# Patient Record
Sex: Female | Born: 1995 | Race: White | Hispanic: No | Marital: Single | Smoking: Never smoker
Health system: Southern US, Community
[De-identification: ages and names within clinical notes are randomized; demographics above are authoritative.]

---

## 2015-01-14 ENCOUNTER — Observation Stay
Admission: EM | Admit: 2015-01-14 | Discharge: 2015-01-15 | Disposition: A | Discharge status: Home / Self Care | Payer: Managed Care, Other (non HMO) | Attending: Surgery | Admitting: Surgery

## 2015-01-14 ENCOUNTER — Emergency Department: Payer: Managed Care, Other (non HMO)

## 2015-01-14 ENCOUNTER — Encounter: Payer: Self-pay | Admitting: Emergency Medicine

## 2015-01-14 DIAGNOSIS — R1010 Upper abdominal pain, unspecified: Secondary | ICD-10-CM | POA: Diagnosis not present

## 2015-01-14 DIAGNOSIS — R1031 Right lower quadrant pain: Principal | ICD-10-CM | POA: Insufficient documentation

## 2015-01-14 DIAGNOSIS — R109 Unspecified abdominal pain: Secondary | ICD-10-CM | POA: Diagnosis present

## 2015-01-14 DIAGNOSIS — N390 Urinary tract infection, site not specified: Secondary | ICD-10-CM | POA: Diagnosis present

## 2015-01-14 LAB — URINALYSIS COMPLETE WITH MICROSCOPIC (ARMC ONLY)
BILIRUBIN URINE: NEGATIVE
Glucose, UA: NEGATIVE mg/dL
Nitrite: NEGATIVE
PH: 5 (ref 5.0–8.0)
Protein, ur: NEGATIVE mg/dL
Specific Gravity, Urine: 1.014 (ref 1.005–1.030)

## 2015-01-14 LAB — COMPREHENSIVE METABOLIC PANEL
ALT: 12 U/L — AB (ref 14–54)
AST: 16 U/L (ref 15–41)
Albumin: 4.3 g/dL (ref 3.5–5.0)
Alkaline Phosphatase: 46 U/L (ref 38–126)
Anion gap: 8 (ref 5–15)
BUN: 8 mg/dL (ref 6–20)
CHLORIDE: 107 mmol/L (ref 101–111)
CO2: 22 mmol/L (ref 22–32)
CREATININE: 0.78 mg/dL (ref 0.44–1.00)
Calcium: 9.2 mg/dL (ref 8.9–10.3)
GFR calc Af Amer: >60 mL/min (ref >60)
GFR calc non Af Amer: >60 mL/min (ref >60)
Glucose, Bld: 117 mg/dL — ABNORMAL HIGH (ref 65–99)
POTASSIUM: 3.8 mmol/L (ref 3.5–5.1)
SODIUM: 137 mmol/L (ref 135–145)
Total Bilirubin: 0.6 mg/dL (ref 0.3–1.2)
Total Protein: 7.3 g/dL (ref 6.5–8.1)

## 2015-01-14 LAB — CBC
HEMATOCRIT: 38.6 % (ref 35.0–47.0)
Hemoglobin: 12.7 g/dL (ref 12.0–16.0)
MCH: 26 pg (ref 26.0–34.0)
MCHC: 32.9 g/dL (ref 32.0–36.0)
MCV: 79 fL — AB (ref 80.0–100.0)
PLATELETS: 262 10*3/uL (ref 150–440)
RBC: 4.89 MIL/uL (ref 3.80–5.20)
RDW: 16.8 % — AB (ref 11.5–14.5)
WBC: 9.3 10*3/uL (ref 3.6–11.0)

## 2015-01-14 LAB — POCT PREGNANCY, URINE: PREG TEST UR: NEGATIVE

## 2015-01-14 LAB — LIPASE, BLOOD: LIPASE: 19 U/L (ref 11–51)

## 2015-01-14 MED ORDER — IOHEXOL 300 MG/ML  SOLN
100.0000 mL | Freq: Once | INTRAMUSCULAR | Status: AC | PRN
  Administered 2015-01-14: 100 mL via INTRAVENOUS
  Filled 2015-01-14: qty 100

## 2015-01-14 MED ORDER — DEXTROSE 5 % IV SOLN
1.0000 g | Freq: Once | INTRAVENOUS | Status: AC
  Administered 2015-01-14: 1 g via INTRAVENOUS
  Filled 2015-01-14: qty 10

## 2015-01-14 MED ORDER — DEXTROSE 5 % IV SOLN
INTRAVENOUS | Status: AC
  Administered 2015-01-14: 1 g via INTRAVENOUS
  Filled 2015-01-14: qty 10

## 2015-01-14 MED ORDER — IOHEXOL 240 MG/ML SOLN
25.0000 mL | Freq: Once | INTRAMUSCULAR | Status: AC | PRN
  Administered 2015-01-14: 25 mL via ORAL
  Filled 2015-01-14: qty 25

## 2015-01-14 MED ORDER — CEPHALEXIN 500 MG PO CAPS: 500.0000 mg | ORAL_CAPSULE | Freq: Two times a day (BID) | ORAL | Status: DC

## 2015-01-14 MED ORDER — SODIUM CHLORIDE 0.9 % IV BOLUS (SEPSIS)
1000.0000 mL | Freq: Once | INTRAVENOUS | Status: AC
  Administered 2015-01-14: 1000 mL via INTRAVENOUS

## 2015-01-14 NOTE — Discharge Instructions (Signed)
You were evaluated for abdominal pain, and found have a urinary tract infection and her being treated with antibiotic KEFLEX. Your CT scan showed the possibility of early appendicitis, but in discussion with the general surgeon, Dr. Excell Seltzer, you all decided to discharge home tonight and return to the emergency department at any point time if you worsen.  Return to emergency for any worsening abdominal pain, fever, vomiting and diarrhea with concern for dehydration, vaginal pain or discharge, or any other symptoms concerning to you.   Abdominal Pain, Adult Many things can cause abdominal pain. Usually, abdominal pain is not caused by a disease and will improve without treatment. It can often be observed and treated at home. Your health care provider will do a physical exam and possibly order blood tests and X-rays to help determine the seriousness of your pain. However, in many cases, more time must pass before a clear cause of the pain can be found. Before that point, your health care provider may not know if you need more testing or further treatment. HOME CARE INSTRUCTIONS Monitor your abdominal pain for any changes. The following actions may help to alleviate any discomfort you are experiencing:  Only take over-the-counter or prescription medicines as directed by your health care provider.  Do not take laxatives unless directed to do so by your health care provider.  Try a clear liquid diet (broth, tea, or water) as directed by your health care provider. Slowly move to a bland diet as tolerated. SEEK MEDICAL CARE IF:  You have unexplained abdominal pain.  You have abdominal pain associated with nausea or diarrhea.  You have pain when you urinate or have a bowel movement.  You experience abdominal pain that wakes you in the night.  You have abdominal pain that is worsened or improved by eating food.  You have abdominal pain that is worsened with eating fatty foods.  You have a  fever. SEEK IMMEDIATE MEDICAL CARE IF:  Your pain does not go away within 2 hours.  You keep throwing up (vomiting).  Your pain is felt only in portions of the abdomen, such as the right side or the left lower portion of the abdomen.  You pass bloody or black tarry stools. MAKE SURE YOU:  Understand these instructions.  Will watch your condition.  Will get help right away if you are not doing well or get worse.   This information is not intended to replace advice given to you by your health care provider. Make sure you discuss any questions you have with your health care provider.   Document Released: 10/04/2004 Document Revised: 09/15/2014 Document Reviewed: 09/03/2012 Elsevier Interactive Patient Education 2016 [employer] Inc[employer]  Urinary Tract Infection A urinary tract infection (UTI) can occur any place along the urinary tract. The tract includes the kidneys, ureters, bladder, and urethra. A type of germ called bacteria often causes a UTI. UTIs are often helped with antibiotic medicine.  HOME CARE   If given, take antibiotics as told by your doctor. Finish them even if you start to feel better.  Drink enough fluids to keep your pee (urine) clear or pale yellow.  Avoid tea, drinks with caffeine, and bubbly (carbonated) drinks.  Pee often. Avoid holding your pee in for a long time.  Pee before and after having sex (intercourse).  Wipe from front to back after you poop (bowel movement) if you are a woman. Use each tissue only once. GET HELP RIGHT AWAY IF:   You have back pain.  You have lower belly (abdominal) pain.  You have chills.  You feel sick to your stomach (nauseous).  You throw up (vomit).  Your burning or discomfort with peeing does not go away.  You have a fever.  Your symptoms are not better in 3 days. MAKE SURE YOU:   Understand these instructions.  Will watch your condition.  Will get help right away if you are not doing well or get worse.    This information is not intended to replace advice given to you by your health care provider. Make sure you discuss any questions you have with your health care provider.   Document Released: 06/13/2007 Document Revised: 01/15/2014 Document Reviewed: 07/26/2011 Elsevier Interactive Patient Education [employer] Elsevier Inc.

## 2015-01-14 NOTE — Consult Note (Signed)
Surgical Consultation  01/14/2015  Zyria Fiscus is an 20 y.o. female.   CC: Lower abdominal pain  HPI: This a patient with less than 24 hours of lower abdominal pain it started in the upper abdomen is moved to both lower quadrants she states that she feels much better now and has not been given any pain medications but her pain is improved over what it was. At an episode like this before. She denies fevers or chills. Had a single emesis yesterday and a single emesis today but is no longer nauseated. She denies dysuria denies back pain  History reviewed. No pertinent past medical history.  History reviewed. No pertinent past surgical history.  No family history on file.  Social History:  reports that she has never smoked. She has never used smokeless tobacco. She reports that she drinks alcohol. She reports that she does not use illicit drugs.  Allergies: No Known Allergies  Medications reviewed.   Review of Systems:   Review of Systems  Constitutional: Negative for fever, chills and weight loss.  HENT: Negative.   Eyes: Negative.   Respiratory: Negative.   Cardiovascular: Negative.   Gastrointestinal: Positive for nausea, vomiting and abdominal pain. Negative for heartburn, diarrhea, constipation, blood in stool and melena.  Genitourinary: Negative for dysuria, urgency and frequency.  Musculoskeletal: Negative.   Skin: Negative.   Neurological: Negative.   Endo/Heme/Allergies: Negative.   Psychiatric/Behavioral: Negative.      Physical Exam:  BP 125/63 mmHg  Pulse 72  Temp(Src) 98.7 F (37.1 C) (Oral)  Resp 16  Ht _0  (1.803 m)  Wt 155 lb (70.308 kg)  BMI 21.63 kg/m2  SpO2 100%  LMP 12/31/2014  Physical Exam  Constitutional: She is oriented to person, place, and time.  Pulmonary/Chest: Effort normal. No respiratory distress. She has no wheezes.  Abdominal: Soft. She exhibits no distension. There is tenderness. There is no rebound and no guarding.  Minimal  abdominal tenderness in the lower quadrants left greater than right with no percussion or rebound tenderness and a negative Rovsing sign  Musculoskeletal: Normal range of motion. She exhibits no edema or tenderness.  Neurological: She is alert and oriented to person, place, and time.  Skin: Skin is warm and dry. No rash noted. No erythema.  Psychiatric: Mood and affect normal.  Vitals reviewed.     Results for orders placed or performed during the hospital encounter of 01/14/15 (from the past 48 hour(s))  Lipase, blood     Status: None   Collection Time: 01/14/15  6:40 PM  Result Value Ref Range   Lipase 19 11 - 51 U/L  Comprehensive metabolic panel     Status: Abnormal   Collection Time: 01/14/15  6:40 PM  Result Value Ref Range   Sodium 137 135 - 145 mmol/L   Potassium 3.8 3.5 - 5.1 mmol/L   Chloride 107 101 - 111 mmol/L   CO2 22 22 - 32 mmol/L   Glucose, Bld 117 (H) 65 - 99 mg/dL   BUN 8 6 - 20 mg/dL   Creatinine, Ser 0.78 0.44 - 1.00 mg/dL   Calcium 9.2 8.9 - 10.3 mg/dL   Total Protein 7.3 6.5 - 8.1 g/dL   Albumin 4.3 3.5 - 5.0 g/dL   AST 16 15 - 41 U/L   ALT 12 (L) 14 - 54 U/L   Alkaline Phosphatase 46 38 - 126 U/L   Total Bilirubin 0.6 0.3 - 1.2 mg/dL   GFR calc non Af Amer >60 >  60 mL/min   GFR calc Af Amer >60 >60 mL/min    Comment: (NOTE) The eGFR has been calculated using the CKD EPI equation. This calculation has not been validated in all clinical situations. eGFR's persistently <60 mL/min signify possible Chronic Kidney Disease.    Anion gap 8 5 - 15  CBC     Status: Abnormal   Collection Time: 01/14/15  6:40 PM  Result Value Ref Range   WBC 9.3 3.6 - 11.0 K/uL   RBC 4.89 3.80 - 5.20 MIL/uL   Hemoglobin 12.7 12.0 - 16.0 g/dL   HCT 38.6 35.0 - 47.0 %   MCV 79.0 (L) 80.0 - 100.0 fL   MCH 26.0 26.0 - 34.0 pg   MCHC 32.9 32.0 - 36.0 g/dL   RDW 16.8 (H) 11.5 - 14.5 %   Platelets 262 150 - 440 K/uL  Urinalysis complete, with microscopic ([facility] only)      Status: Abnormal   Collection Time: 01/14/15  6:40 PM  Result Value Ref Range   Color, Urine YELLOW (A) YELLOW   APPearance HAZY (A) CLEAR   Glucose, UA NEGATIVE NEGATIVE mg/dL   Bilirubin Urine NEGATIVE NEGATIVE   Ketones, ur 1+ (A) NEGATIVE mg/dL   Specific Gravity, Urine 1.014 1.005 - 1.030   Hgb urine dipstick 1+ (A) NEGATIVE   pH 5.0 5.0 - 8.0   Protein, ur NEGATIVE NEGATIVE mg/dL   Nitrite NEGATIVE NEGATIVE   Leukocytes, UA TRACE (A) NEGATIVE   RBC / HPF 0-5 0 - 5 RBC/hpf   WBC, UA 6-30 0 - 5 WBC/hpf   Bacteria, UA RARE (A) NONE SEEN   Squamous Epithelial / LPF 6-30 (A) NONE SEEN   Mucous PRESENT   Pregnancy, urine POC     Status: None   Collection Time: 01/14/15  6:43 PM  Result Value Ref Range   Preg Test, Ur NEGATIVE NEGATIVE    Comment:        THE SENSITIVITY OF THIS METHODOLOGY IS >24 mIU/mL    Ct Abdomen Pelvis W Contrast  01/14/2015  CLINICAL DATA:  Upper abdominal pain beginning yesterday. Pain more in the low abdomen today. Some nausea vomiting. EXAM: CT ABDOMEN AND PELVIS WITH CONTRAST TECHNIQUE: Multidetector CT imaging of the abdomen and pelvis was performed using the standard protocol following bolus administration of intravenous contrast. CONTRAST:  116m OMNIPAQUE IOHEXOL 300 MG/ML  SOLN COMPARISON:  None. FINDINGS: Lung bases:  Eft clear.  Heart normal size. Liver, spleen, gallbladder, pancreas, adrenal glands:  Normal. Kidneys, ureters, bladder:  Normal. Uterus and adnexa: Uterus normal in size attenuation. No adnexal masses. There is a small amount of free fluid in the pelvis. This is greater than generally seen for physiologic free fluid. Consider a ruptured ovarian cyst. Lymph nodes:  No adenopathy. Gastrointestinal: The appendix extends posteriorly from the cecal tip. Typically appendix lies adjacent to the right adnexa. There is focal dilation of the appendiceal tip to just under 1 cm with the remainder the appendix normal in caliber. Stomach, colon and small  bowel are within normal limits. Musculoskeletal:  Unremarkable. IMPRESSION: 1. There is a small amount pelvic free fluid, greater than generally seen for physiologic fluid. Suspected ruptured ovarian cyst. Consider follow-up transabdominal and endovaginal ultrasound for further assessment. 2. There is also focal dilation of the tip of the appendix, which lies adjacent to the right ovary. Early acute appendicitis is possible. This could be reassessed with repeat CT imaging of pelvis 12-24 hours depending on the patient's clinical  presentation 3. No other abnormalities. Electronically Signed   By: Lajean Manes M.D.   On: 01/14/2015 22:20    Assessment/Plan:  CT scan and labs are personally reviewed. This could represent early appendicitis the patient's pain is better and her exam is not indicative of appendicitis and certainly not indicative of the need for urgent laparoscopy. A pelvic ultrasound is currently pending but that would not change my analysis of her risk for appendicitis as the ultrasound is notoriously inaccurate for appendicitis. Without a mind I've discussed with her observation in the hospital versus follow-up in the ED should she worsen. She is going to make that decision based on the ultrasound although that would not necessarily change my recommendations against urgent surgery unless she were to worsen. I have offered observation versus outpatient follow-up through the ED and I discussed this with the patient no family was present and I discussed it with Dr. Reita Cliche in the ED.  Florene Glen, MD, FACS

## 2015-01-14 NOTE — ED Notes (Signed)
[facility] pt to the lobby[facility], today lower abd pain with vomiting , 126/60, HR 68 , 99% RA

## 2015-01-14 NOTE — ED Provider Notes (Signed)
[facility] Emergency Department Provider Note   ____________________________________________  Time seen:  I have reviewed the triage vital signs and the triage nursing note.  HISTORY  Chief Complaint Abdominal Pain; Nausea; and Emesis   Historian Patient  HPI Sarah Santana is a 20 y.o. female who is here for evaluation of abdominal pain. Pain started yesterday and was in the area of the mid abdomen and today it was somewhat worse and located in the lower abdomen. She did have nausea today with vomiting on the way here. No urinary symptoms. No fever. No irregular vaginal bleeding. No vaginal discharge. No diarrhea.    History reviewed. No pertinent past medical history.  Patient Active Problem List   Diagnosis Date Noted  . Right lower quadrant pain   . Urinary tract infectious disease     History reviewed. No pertinent past surgical history.  No current outpatient prescriptions on file.  Allergies Review of patient's allergies indicates no known allergies.  No family history on file.  Social History Social History  Substance Use Topics  . Smoking status: Never Smoker   . Smokeless tobacco: Never Used  . Alcohol Use: Yes     Comment: occasionally    Review of Systems  Constitutional: Negative for fever. Eyes: Negative for visual changes. ENT: Negative for sore throat. Cardiovascular: Negative for chest pain. Respiratory: Negative for shortness of breath. Gastrointestinal: As per history of present illness Genitourinary: Negative for dysuria. Musculoskeletal: Negative for back pain. Skin: Negative for rash. Neurological: Negative for headache. 10 point Review of Systems otherwise negative ____________________________________________   PHYSICAL EXAM:  VITAL SIGNS: ED Triage Vitals  Enc Vitals Group     BP 01/14/15 1836 125/63 mmHg     Pulse Rate 01/14/15 1836 72     Resp 01/14/15 1836 16     Temp 01/14/15 1836 98.7 F (37.1  C)     Temp Source 01/14/15 1836 Oral     SpO2 01/14/15 1836 100 %     Weight 01/14/15 1836 155 lb (70.308 kg)     Height 01/14/15 1836 5\' 11"  (1.803 m)     Head Cir --      Peak Flow --      Pain Score 01/14/15 1836 7     Pain Loc --      Pain Edu? --      Excl. in GC? --      Constitutional: Alert and oriented. Well appearing and in no distress. Eyes: Conjunctivae are normal. PERRL. Normal extraocular movements. ENT   Head: Normocephalic and atraumatic.   Nose: No congestion/rhinnorhea.   Mouth/Throat: Mucous membranes are moist.   Neck: No stridor. Cardiovascular/Chest: Normal rate, regular rhythm.  No murmurs, rubs, or gallops. Respiratory: Normal respiratory effort without tachypnea nor retractions. Breath sounds are clear and equal bilaterally. No wheezes/rales/rhonchi. Gastrointestinal: Soft. No distention, no guarding, no rebound.  Mild suprapubic tenderness, moderate right lower quadrant tenderness Genitourinary/rectal:Deferred Musculoskeletal: Nontender with normal range of motion in all extremities. No joint effusions.  No lower extremity tenderness.  No edema. Neurologic:  Normal speech and language. No gross or focal neurologic deficits are appreciated. Skin:  Skin is warm, dry and intact. No rash noted. Psychiatric: Mood and affect are normal. Speech and behavior are normal. Patient exhibits appropriate insight and judgment.  ____________________________________________   EKG I, Governor Rooks, MD, the attending physician have personally viewed and interpreted all ECGs.  None ____________________________________________  LABS (pertinent positives/negatives)  Urine pregnancy test negative Lipase  19 Cooperative metabolic panel without significant abnormality White blood cell count is 9.3, hemoglobin 12.7 and platelet count 262 Urinalysis trace leukocytes, 6-30 white blood cells and rare bacteria, 1+  ketones  ____________________________________________  RADIOLOGY All Xrays were viewed by me. Imaging interpreted by Radiologist.  CT abdomen and pelvis with contrast:  IMPRESSION: 1. There is a small amount pelvic free fluid, greater than generally seen for physiologic fluid. Suspected ruptured ovarian cyst. Consider follow-up transabdominal and endovaginal ultrasound for further assessment. 2. There is also focal dilation of the tip of the appendix, which lies adjacent to the right ovary. Early acute appendicitis is possible. This could be reassessed with repeat CT imaging of pelvis 12-24 hours depending on the patient's clinical presentation 3. No other abnormalities.  Pelvic and transvaginal ultrasound: Pending __________________________________________  PROCEDURES  Procedure(s) performed: None  Critical Care performed: None  ____________________________________________   ED COURSE / ASSESSMENT AND PLAN  CONSULTATIONS: Dr. Excell Seltzerooper, general surgery. Saw the patient and discussed observation at home versus in the hospital overnight, feels early appendicitis unlikely.  Pertinent labs & imaging results that were available during my care of the patient were reviewed by me and considered in my medical decision making (see chart for details).  Patient's pain starting mid abdomen and now the right lower quadrant raises some possibly for early appendicitis, however she doesn't have a fever, and has no elevated white blood count.  Her urinalysis consistent with urinary tract infection. She was given IV dose of Rocephin here. Patient will be discharged with Keflex is able to go home.  CT the abdomen showed signs of possible early appendicitis, but also possible ruptured ovarian cyst. Dr. Excell Seltzerooper of Gen. surgery was consulted given the clinical symptoms and CT scan findings, and in discussion with the patient offered observation in the hospital versus at home. Patient is actually  already feeling much better and would like to go home. She knows to return to the department for any worsening abdominal pain, and certainly for any fever.  Patient care transferred to Dr. Zenda AlpersWebster at shift change 11 PM. Transvaginal ultrasound is pending. If negative, we'll patient will be discharged home with my instructions.  Patient / Family / Caregiver informed of clinical course, medical decision-making process, and agree with plan.   I discussed return precautions, follow-up instructions, and discharged instructions with patient and/or family.  ___________________________________________   FINAL CLINICAL IMPRESSION(S) / ED DIAGNOSES   Final diagnoses:  Urinary tract infection without hematuria, site unspecified  Right lower quadrant pain              Note: This dictation was prepared with Dragon dictation. Any transcriptional errors that result from this process are unintentional   Governor Rooksebecca Meer Reindl, MD 01/14/15 [phone]

## 2015-01-14 NOTE — ED Notes (Signed)
Patient returned from CT

## 2015-01-14 NOTE — ED Notes (Signed)
C/o upper abdominal pain yesterday when going to bed (around midnight).  Today c/o lower abdominal pain.  Some nausea and vomiting en route to hospital.  Denies dysuria or vaginal discharge.

## 2015-01-15 ENCOUNTER — Encounter: Payer: Self-pay | Admitting: *Deleted

## 2015-01-15 DIAGNOSIS — R1031 Right lower quadrant pain: Secondary | ICD-10-CM | POA: Diagnosis not present

## 2015-01-15 DIAGNOSIS — R109 Unspecified abdominal pain: Secondary | ICD-10-CM | POA: Diagnosis present

## 2015-01-15 DIAGNOSIS — N390 Urinary tract infection, site not specified: Secondary | ICD-10-CM | POA: Diagnosis not present

## 2015-01-15 LAB — CBC
HCT: 35.2 % (ref 35.0–47.0)
Hemoglobin: 11.7 g/dL — ABNORMAL LOW (ref 12.0–16.0)
MCH: 26.1 pg (ref 26.0–34.0)
MCHC: 33.2 g/dL (ref 32.0–36.0)
MCV: 78.6 fL — ABNORMAL LOW (ref 80.0–100.0)
PLATELETS: 203 10*3/uL (ref 150–440)
RBC: 4.47 MIL/uL (ref 3.80–5.20)
RDW: 16.9 % — AB (ref 11.5–14.5)
WBC: 11.8 10*3/uL — AB (ref 3.6–11.0)

## 2015-01-15 MED ORDER — ONDANSETRON HCL 4 MG/2ML IJ SOLN
4.0000 mg | Freq: Four times a day (QID) | INTRAMUSCULAR | Status: DC | PRN
Start: 2015-01-15 — End: 2015-01-15

## 2015-01-15 MED ORDER — HEPARIN SODIUM (PORCINE) 5000 UNIT/ML IJ SOLN
5000.0000 [IU] | Freq: Three times a day (TID) | INTRAMUSCULAR | Status: DC
  Administered 2015-01-15: 5000 [IU] via SUBCUTANEOUS
  Filled 2015-01-15 (×3): qty 1

## 2015-01-15 MED ORDER — ONDANSETRON HCL 4 MG PO TABS: 4.0000 mg | ORAL_TABLET | Freq: Four times a day (QID) | ORAL | Status: DC | PRN

## 2015-01-15 MED ORDER — ACETAMINOPHEN 325 MG PO TABS
650.0000 mg | ORAL_TABLET | Freq: Once | ORAL | Status: AC
  Administered 2015-01-15: 650 mg via ORAL
  Filled 2015-01-15: qty 2

## 2015-01-15 MED ORDER — CEPHALEXIN 500 MG PO CAPS: 500.0000 mg | ORAL_CAPSULE | Freq: Four times a day (QID) | ORAL | Status: AC

## 2015-01-15 MED ORDER — MORPHINE SULFATE (PF) 2 MG/ML IV SOLN: 2.0000 mg | INTRAVENOUS | Status: DC | PRN

## 2015-01-15 MED ORDER — DEXTROSE 5 % IV SOLN
2.0000 g | INTRAVENOUS | Status: DC
  Administered 2015-01-15: 2 g via INTRAVENOUS
  Filled 2015-01-15 (×2): qty 2

## 2015-01-15 MED ORDER — DEXTROSE 5 % IV SOLN
1.0000 g | INTRAVENOUS | Status: DC
  Filled 2015-01-15: qty 10

## 2015-01-15 MED ORDER — LACTATED RINGERS IV SOLN
INTRAVENOUS | Status: DC
  Administered 2015-01-15 (×2): via INTRAVENOUS

## 2015-01-15 NOTE — Progress Notes (Signed)
Pt. DC'ed, left unit on wheelchair accompanied by two friends. I followed them off the unit and they noted that they would take her down. I advised the driver to pull the car around to the visitor's door and leave the wheelchair in the lobby when done.

## 2015-01-15 NOTE — Progress Notes (Signed)
I was called by Dr. Zenda AlpersWebster in the ED stating that the patient wanted to stay overnight because it was late but her pain was much improved and the ultrasound showed only minimal free fluid in the pelvis. I agreed to write orders for observation and reexamination.

## 2015-01-15 NOTE — Progress Notes (Signed)
CC: Abdominal pain Subjective: 20 year old female brought under observation for abdominal pain. Patient states that the pain is different than when she first came in and that is now more of a soreness in her lateral abdomen. She denies any fevers, chills, nausea, vomiting.  Objective: Vital signs in last 24 hours: Temp:  [98.7 F (37.1 C)-100.9 F (38.3 C)] 99.2 F (37.3 C) (01/07 0617) Pulse Rate:  [72-96] 80 (01/07 0617) Resp:  [16-18] 18 (01/06 2300) BP: (125-149)/(63-77) 127/65 mmHg (01/07 0617) SpO2:  [97 %-100 %] 97 % (01/07 0617) Weight:  [70.308 kg (155 lb)] 70.308 kg (155 lb) (01/06 1836) Last BM Date: 01/14/15  Intake/Output from previous day: 01/06 0701 - 01/07 0700 In: 1350 [P.O.:300; IV Piggyback:1050] Out: 500 [Urine:500] Intake/Output this shift: Total I/O In: -  Out: 800 [Urine:800]  Physical exam:  Gen.: No acute distress Chest: Clear to auscultation Heart: Regular rhythm Abdomen: Soft, minimally tender in the lateral quadrants, nondistended. No McBurney sign, no Rovsing sign, no peritonitis.  Lab Results: CBC   Recent Labs  01/14/15 1840 01/15/15 0443  WBC 9.3 11.8*  HGB 12.7 11.7*  HCT 38.6 35.2  PLT 262 203   BMET  Recent Labs  01/14/15 1840  NA 137  K 3.8  CL 107  CO2 22  GLUCOSE 117*  BUN 8  CREATININE 0.78  CALCIUM 9.2   PT/INR No results for input(s): LABPROT, INR in the last 72 hours. ABG No results for input(s): PHART, HCO3 in the last 72 hours.  Invalid input(s): PCO2, PO2  Studies/Results: [place] Transvaginal Non-ob  01/15/2015  CLINICAL DATA:  RIGHT lower quadrant pain beginning this afternoon. EXAM: TRANSABDOMINAL AND TRANSVAGINAL ULTRASOUND OF PELVIS TECHNIQUE: Both transabdominal and transvaginal ultrasound examinations of the pelvis were performed. Transabdominal technique was performed for global imaging of the pelvis including uterus, ovaries, adnexal regions, and pelvic cul-de-sac. It was necessary to proceed with  endovaginal exam following the transabdominal exam to visualize the endometrium and ovaries. COMPARISON:  CT abdomen and pelvis January 13, 2014 at 2159 hours FINDINGS: Uterus Measurements: 6.1 x 2.9 x 3.6 cm. No fibroids or other mass visualized. Endometrium Thickness: 3.4 mm.  No focal abnormality visualized. Right ovary Measurements: 4.7 x 3.1 x 2.6 cm. Normal appearance/no adnexal mass. Left ovary Measurements: 3.2 x 2.4 x 2.7 cm. Normal appearance/no adnexal mass. Other findings Moderate amount of mildly echogenic free fluid in the pelvis, predominately toward the RIGHT. IMPRESSION: Moderate amount of free fluid open (possible hemoperitoneum) in the pelvis, seen on recent CT. Otherwise normal pelvic ultrasound. Electronically Signed   By: Awilda Metroourtnay  Bloomer M.D.   On: 01/15/2015 00:22   [place] Pelvis Complete  01/15/2015  CLINICAL DATA:  RIGHT lower quadrant pain beginning this afternoon. EXAM: TRANSABDOMINAL AND TRANSVAGINAL ULTRASOUND OF PELVIS TECHNIQUE: Both transabdominal and transvaginal ultrasound examinations of the pelvis were performed. Transabdominal technique was performed for global imaging of the pelvis including uterus, ovaries, adnexal regions, and pelvic cul-de-sac. It was necessary to proceed with endovaginal exam following the transabdominal exam to visualize the endometrium and ovaries. COMPARISON:  CT abdomen and pelvis January 13, 2014 at 2159 hours FINDINGS: Uterus Measurements: 6.1 x 2.9 x 3.6 cm. No fibroids or other mass visualized. Endometrium Thickness: 3.4 mm.  No focal abnormality visualized. Right ovary Measurements: 4.7 x 3.1 x 2.6 cm. Normal appearance/no adnexal mass. Left ovary Measurements: 3.2 x 2.4 x 2.7 cm. Normal appearance/no adnexal mass. Other findings Moderate amount of mildly echogenic free fluid in the pelvis, predominately toward  the RIGHT. IMPRESSION: Moderate amount of free fluid open (possible hemoperitoneum) in the pelvis, seen on recent CT. Otherwise normal pelvic  ultrasound. Electronically Signed   By: Awilda Metro M.D.   On: 01/15/2015 00:22   Ct Abdomen Pelvis W Contrast  01/14/2015  CLINICAL DATA:  Upper abdominal pain beginning yesterday. Pain more in the low abdomen today. Some nausea vomiting. EXAM: CT ABDOMEN AND PELVIS WITH CONTRAST TECHNIQUE: Multidetector CT imaging of the abdomen and pelvis was performed using the standard protocol following bolus administration of intravenous contrast. CONTRAST:  OMNIPAQUE IOHEXOL 300 MG/ML  SOLN COMPARISON:  None. FINDINGS: Lung bases:  Eft clear.  Heart normal size. Liver, spleen, gallbladder, pancreas, adrenal glands:  Normal. Kidneys, ureters, bladder:  Normal. Uterus and adnexa: Uterus normal in size attenuation. No adnexal masses. There is a small amount of free fluid in the pelvis. This is greater than generally seen for physiologic free fluid. Consider a ruptured ovarian cyst. Lymph nodes:  No adenopathy. Gastrointestinal: The appendix extends posteriorly from the cecal tip. Typically appendix lies adjacent to the right adnexa. There is focal dilation of the appendiceal tip to just under 1 cm with the remainder the appendix normal in caliber. Stomach, colon and small bowel are within normal limits. Musculoskeletal:  Unremarkable. IMPRESSION: 1. There is a small amount pelvic free fluid, greater than generally seen for physiologic fluid. Suspected ruptured ovarian cyst. Consider follow-up transabdominal and endovaginal ultrasound for further assessment. 2. There is also focal dilation of the tip of the appendix, which lies adjacent to the right ovary. Early acute appendicitis is possible. This could be reassessed with repeat CT imaging of pelvis 12-24 hours depending on the patient's clinical presentation 3. No other abnormalities. Electronically Signed   By: Amie Portland M.D.   On: 01/14/2015 22:20    Anti-infectives: Anti-infectives    Start     Dose/Rate Route Frequency Ordered Stop   01/15/15 1000   cefTRIAXone (ROCEPHIN) 1 g in dextrose 5 % 50 mL IVPB  Status:  Discontinued     1 g 100 mL/hr over 30 Minutes Intravenous Every 24 hours 01/15/15 0810 01/15/15 0816   01/15/15 1000  cefTRIAXone (ROCEPHIN) 2 g in dextrose 5 % 50 mL IVPB     2 g 100 mL/hr over 30 Minutes Intravenous Every 24 hours 01/15/15 0816     01/14/15 2100  cefTRIAXone (ROCEPHIN) 1 g in dextrose 5 % 50 mL IVPB     1 g 100 mL/hr over 30 Minutes Intravenous  Once 01/14/15 2052 01/14/15 2207   01/14/15 0000  cephALEXin (KEFLEX) 500 MG capsule     500 mg Oral 2 times daily 01/14/15 2335        Assessment/Plan:  20 year old female admitted for atypical abdominal pain for rule out appendicitis. Physical exam is not currently consistent with appendicitis. We'll perform a serial abdominal exam early this afternoon and if it continues to be improved we'll then start a diet with plans for discharge if she tolerates diet without return of her pain.  Elyzabeth Goatley T. Tonita Cong, MD, FACS  01/15/2015

## 2015-01-15 NOTE — Progress Notes (Signed)
Dr. Woodham met with and examined the patient a few minutes ago. He stated the patient felt well and wanted to go home and that was consistent with his exam. He and I discussed discharge and follow-up with primary care physician. We discussed sending her home on Keflex because she had been treated for UTI initially. 

## 2015-01-15 NOTE — ED Provider Notes (Signed)
-----------------------------------------   1:27 AM on 01/15/2015 -----------------------------------------   Blood pressure 149/77, pulse 96, temperature 100.9 F (38.3 C), temperature source Oral, resp. rate 18, height 5\' 11"  (1.803 m), weight 155 lb (70.308 kg), last menstrual period 01/04/2015, SpO2 100 %.  Assuming care from Dr. Shaune PollackLord.  In short, Sarah Santana is a 20 y.o. female with a chief complaint of Abdominal Pain; Nausea; and Emesis .  Refer to the original H&P for additional details.  The current plan of care is to follow up the results of the ultrasound.  Ultrasound: Moderate amount of free fluid in the pelvis, seen on recent CT otherwise normal pelvic ultrasound. Possible hemoperitoneum on the right.  I went in to speak to the patient and she reports that she was feeling okay and her pain was improved. I discussed the patient's previous talk with Dr. Excell Seltzerooper about staying for observation versus going home. The patient felt that the night as well as her possible appendicitis that she should stay in the hospital. I contacted Excell Seltzerooper who will admit the patient for observation.   Rebecka ApleyAllison P Yazlin Ekblad, MD 01/15/15 0130

## 2015-01-15 NOTE — Progress Notes (Signed)
ANTIBIOTIC CONSULT NOTE - INITIAL  Pharmacy Consult for Ceftriaxone Indication: intra-abdominal infection  No Known Allergies  Patient Measurements: Height: 5\' 11"  (180.3 cm) Weight: 155 lb (70.308 kg) IBW/kg (Calculated) : 70.8 *  Vital Signs: Temp: 99.2 F (37.3 C) (01/07 0617) Temp Source: Oral (01/07 0617) BP: 127/65 mmHg (01/07 0617) Pulse Rate: 80 (01/07 0617) Intake/Output from previous day: 01/06 0701 - 01/07 0700 In: 1350 [P.O.:300; IV Piggyback:1050] Out: 500 [Urine:500] Intake/Output from this shift:    Labs:  Recent Labs  01/14/15 1840 01/15/15 0443  WBC 9.3 11.8*  HGB 12.7 11.7*  PLT 262 203  CREATININE 0.78  --    Estimated Creatinine Clearance: 124.5 mL/min (by C-G formula based on Cr of 0.78). No results for input(s): VANCOTROUGH, VANCOPEAK, VANCORANDOM, GENTTROUGH, GENTPEAK, GENTRANDOM, TOBRATROUGH, TOBRAPEAK, TOBRARND, AMIKACINPEAK, AMIKACINTROU, AMIKACIN in the last 72 hours.   Microbiology: No results found for this or any previous visit (from the past 720 hour(s)).   Assessment: 20 yo female with possible early appendicitis with WBC trending up to start on ceftriaxone.   Goal of Therapy:  Resolution of infection]  Plan:  Will order ceftriaxone 2g IV q24h.  Pharmacy will continue to follow.   Marty HeckWang, Curtiss Mahmood L 01/15/2015,8:11 AM

## 2015-01-15 NOTE — Discharge Summary (Signed)
Physician Discharge Summary  Patient ID: Varney BaasSarah Lagos MRN: [id number] DOB/AGE: 1995-06-13 19 y.o.  Admit date: 01/14/2015 Discharge date: 01/15/2015   Discharge Diagnoses:  Active Problems:   Abdominal pain   Procedures:none  Hospital Course: This patient admitted the hospital with possible early appendicitis and a possible urinary tract infection. She had abdominal pain in both lower quadrants and a workup in the emergency room with equivocal CT scan ultrasound. She was admitted the hospital and treated with Rocephin for a presumed mild UTI. In the hospital her pain improved and is nearly resolved she was seen by Dr. Tonita CongWoodham prior to discharge and she was tolerating a diet and will follow up with her primary care physician she will be sent home on Keflex 500 4 times a day to follow up with her primary care physician. She's also instructed to return should her pain come back.  Consults: None  Disposition: Final discharge disposition not confirmed     Medication List    TAKE these medications        cephALEXin 500 MG capsule  Commonly known as:  KEFLEX  Take 1 capsule (500 mg total) by mouth 4 (four) times daily.           Follow-up Information    [facility] Schedule an appointment as soon as possible for a visit in 1 week.   Contact information:   [address] [place] [phone]       Follow up In 5 days.      Lattie Hawichard E Minor Iden, MD, FACS

## 2015-01-15 NOTE — Progress Notes (Signed)
CC: Lower abdominal pain Subjective: Patient has been admitted to the hospital with lower abdominal pain for observation for possible early appendicitis. She states she is no better or no worse today but is not hungry her pain is still in both lower quadrants not localized to the right she has some mild nausea but has not vomited denies fevers or chills. Denies dysuria denies back pain denies hematuria  Objective: Vital signs in last 24 hours: Temp:  [98.7 F (37.1 C)-100.9 F (38.3 C)] 99.2 F (37.3 C) (01/07 0617) Pulse Rate:  [72-96] 80 (01/07 0617) Resp:  [16-18] 18 (01/06 2300) BP: (125-149)/(63-77) 127/65 mmHg (01/07 0617) SpO2:  [97 %-100 %] 97 % (01/07 0617) Weight:  [155 lb (70.308 kg)] 155 lb (70.308 kg) (01/06 1836) Last BM Date: 01/14/15  Intake/Output from previous day: 01/06 0701 - 01/07 0700 In: 1350 [P.O.:300; IV Piggyback:1050] Out: 500 [Urine:500] Intake/Output this shift:    Physical exam:  Vital signs reviewed Appears slightly more uncomfortable than yesterday Abdomen is soft nondistended slightly more tender in both lower quadrants right and left side with some mild guarding but no rebound or percussion tenderness calves are nontender neuro is grossly intact integument shows no jaundice  Lab Results: CBC   Recent Labs  01/14/15 1840 01/15/15 0443  WBC 9.3 11.8*  HGB 12.7 11.7*  HCT 38.6 35.2  PLT 262 203   BMET  Recent Labs  01/14/15 1840  NA 137  K 3.8  CL 107  CO2 22  GLUCOSE 117*  BUN 8  CREATININE 0.78  CALCIUM 9.2   PT/INR No results for input(s): LABPROT, INR in the last 72 hours. ABG No results for input(s): PHART, HCO3 in the last 72 hours.  Invalid input(s): PCO2, PO2  Studies/Results: [place] Transvaginal Non-ob  01/15/2015  CLINICAL DATA:  RIGHT lower quadrant pain beginning this afternoon. EXAM: TRANSABDOMINAL AND TRANSVAGINAL ULTRASOUND OF PELVIS TECHNIQUE: Both transabdominal and transvaginal ultrasound examinations of the  pelvis were performed. Transabdominal technique was performed for global imaging of the pelvis including uterus, ovaries, adnexal regions, and pelvic cul-de-sac. It was necessary to proceed with endovaginal exam following the transabdominal exam to visualize the endometrium and ovaries. COMPARISON:  CT abdomen and pelvis January 13, 2014 at 2159 hours FINDINGS: Uterus Measurements: 6.1 x 2.9 x 3.6 cm. No fibroids or other mass visualized. Endometrium Thickness: 3.4 mm.  No focal abnormality visualized. Right ovary Measurements: 4.7 x 3.1 x 2.6 cm. Normal appearance/no adnexal mass. Left ovary Measurements: 3.2 x 2.4 x 2.7 cm. Normal appearance/no adnexal mass. Other findings Moderate amount of mildly echogenic free fluid in the pelvis, predominately toward the RIGHT. IMPRESSION: Moderate amount of free fluid open (possible hemoperitoneum) in the pelvis, seen on recent CT. Otherwise normal pelvic ultrasound. Electronically Signed   By: Awilda Metro M.D.   On: 01/15/2015 00:22   US Pelvis Complete  01/15/2015  CLINICAL DATA:  RIGHT lower quadrant pain beginning this afternoon. EXAM: TRANSABDOMINAL AND TRANSVAGINAL ULTRASOUND OF PELVIS TECHNIQUE: Both transabdominal and transvaginal ultrasound examinations of the pelvis were performed. Transabdominal technique was performed for global imaging of the pelvis including uterus, ovaries, adnexal regions, and pelvic cul-de-sac. It was necessary to proceed with endovaginal exam following the transabdominal exam to visualize the endometrium and ovaries. COMPARISON:  CT abdomen and pelvis January 13, 2014 at 2159 hours FINDINGS: Uterus Measurements: 6.1 x 2.9 x 3.6 cm. No fibroids or other mass visualized. Endometrium Thickness: 3.4 mm.  No focal abnormality visualized. Right ovary Measurements: 4.7  x 3.1 x 2.6 cm. Normal appearance/no adnexal mass. Left ovary Measurements: 3.2 x 2.4 x 2.7 cm. Normal appearance/no adnexal mass. Other findings Moderate amount of mildly  echogenic free fluid in the pelvis, predominately toward the RIGHT. IMPRESSION: Moderate amount of free fluid open (possible hemoperitoneum) in the pelvis, seen on recent CT. Otherwise normal pelvic ultrasound. Electronically Signed   By: Awilda Metroourtnay  Bloomer M.D.   On: 01/15/2015 00:22   Ct Abdomen Pelvis W Contrast  01/14/2015  CLINICAL DATA:  Upper abdominal pain beginning yesterday. Pain more in the low abdomen today. Some nausea vomiting. EXAM: CT ABDOMEN AND PELVIS WITH CONTRAST TECHNIQUE: Multidetector CT imaging of the abdomen and pelvis was performed using the standard protocol following bolus administration of intravenous contrast. CONTRAST:  100mL OMNIPAQUE IOHEXOL 300 MG/ML  SOLN COMPARISON:  None. FINDINGS: Lung bases:  Eft clear.  Heart normal size. Liver, spleen, gallbladder, pancreas, adrenal glands:  Normal. Kidneys, ureters, bladder:  Normal. Uterus and adnexa: Uterus normal in size attenuation. No adnexal masses. There is a small amount of free fluid in the pelvis. This is greater than generally seen for physiologic free fluid. Consider a ruptured ovarian cyst. Lymph nodes:  No adenopathy. Gastrointestinal: The appendix extends posteriorly from the cecal tip. Typically appendix lies adjacent to the right adnexa. There is focal dilation of the appendiceal tip to just under 1 cm with the remainder the appendix normal in caliber. Stomach, colon and small bowel are within normal limits. Musculoskeletal:  Unremarkable. IMPRESSION: 1. There is a small amount pelvic free fluid, greater than generally seen for physiologic fluid. Suspected ruptured ovarian cyst. Consider follow-up transabdominal and endovaginal ultrasound for further assessment. 2. There is also focal dilation of the tip of the appendix, which lies adjacent to the right ovary. Early acute appendicitis is possible. This could be reassessed with repeat CT imaging of pelvis 12-24 hours depending on the patient's clinical presentation 3. No  other abnormalities. Electronically Signed   By: Amie Portlandavid  Ormond M.D.   On: 01/14/2015 22:20    Anti-infectives: Anti-infectives    Start     Dose/Rate Route Frequency Ordered Stop   01/14/15 2100  cefTRIAXone (ROCEPHIN) 1 g in dextrose 5 % 50 mL IVPB     1 g 100 mL/hr over 30 Minutes Intravenous  Once 01/14/15 2052 01/14/15 2207   01/14/15 0000  cephALEXin (KEFLEX) 500 MG capsule     500 mg Oral 2 times daily 01/14/15 2335        Assessment/Plan:  Is a patient admitted the hospital with probable early appendicitis for observation. She has slightly worsened on exam but not subjectively. Her white blood cell count has become slightly abnormally elevated. My suspicion is that this is early appendicitis and that she will likely require laparoscopy. I am not convinced of this however and I will discuss with Dr. Tonita CongWoodham the potential for reexamining her later today. Should she worsen she will require laparoscopy. Patient was understanding of this plan.  Lattie Hawichard E Deija Buhrman, MD, FACS  01/15/2015

## 2015-01-16 ENCOUNTER — Telehealth: Payer: Self-pay | Admitting: General Surgery

## 2015-01-16 LAB — URINE CULTURE

## 2015-01-16 NOTE — Telephone Encounter (Signed)
Patient called in concerned because she had recurrent upper abdominal pain. At times she called and she said the pain is gone away again. Pain was exactly like it was prior to coming to the hospital earlier this weekend for which she was watched under observation. Throughout all this she has been afebrile, tolerating a diet, having normal bowel movements.  Patient is merely concerned that she might be doing something wrong and wanted to make sure is nothing else she needed to do.  Counseled the patient as long as she is able to tolerate diet without nausea and vomiting, is not having fevers, continuing to improve then she is doing everything correct.  Patient will call back or return to the emergency department should she have any recurrence and nonresolution of symptoms.  Ricarda Frameharles Ilynn Stauffer, MD FACS General Surgeon [facility]

## 2017-09-01 IMAGING — CT CT ABD-PELV W/ CM
1 of 2 series · 15 of 32 positions shown, 19 images · IV contrast (omnipaque)
Comparison: None.

CLINICAL DATA: Upper abdominal pain beginning yesterday. Pain more
in the low abdomen today. Some nausea vomiting.

EXAM:
CT ABDOMEN AND PELVIS WITH CONTRAST
TECHNIQUE: Multidetector CT imaging of the abdomen and pelvis was performed
using the standard protocol following bolus administration of
intravenous contrast.
CONTRAST:  100mL OMNIPAQUE IOHEXOL 300 MG/ML  SOLN

[Series 2: routine abd pel with · axial · 0.69mm/px · z∈[-562,-102]mm · 15 of 100 slices shown, 19 images]
[im 4/100  soft-tissue]
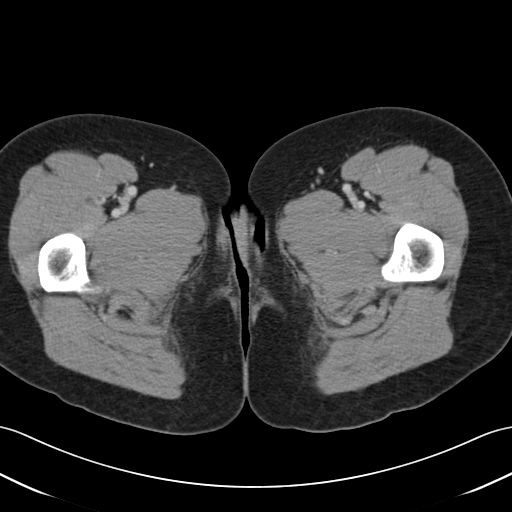
[im 4/100  bone]
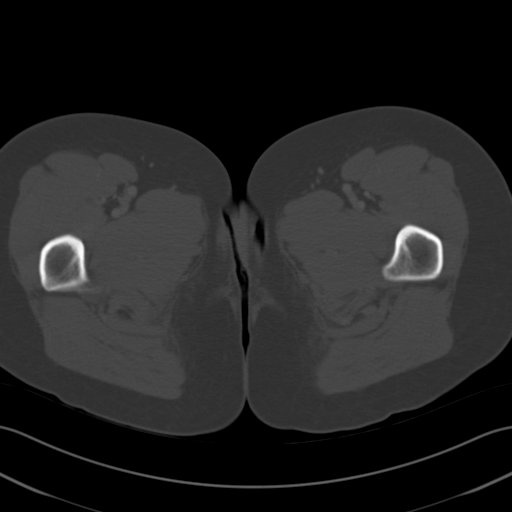
[im 12/100  soft-tissue]
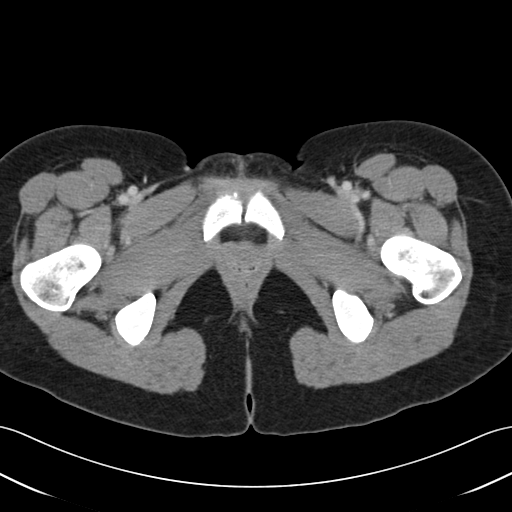
[im 20/100  soft-tissue]
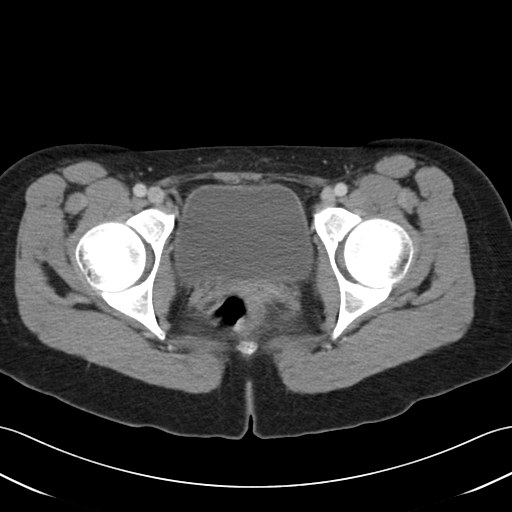
[im 27/100  soft-tissue]
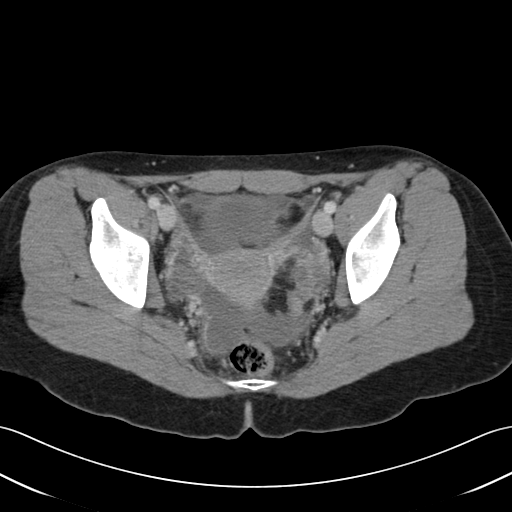
[im 35/100  soft-tissue]
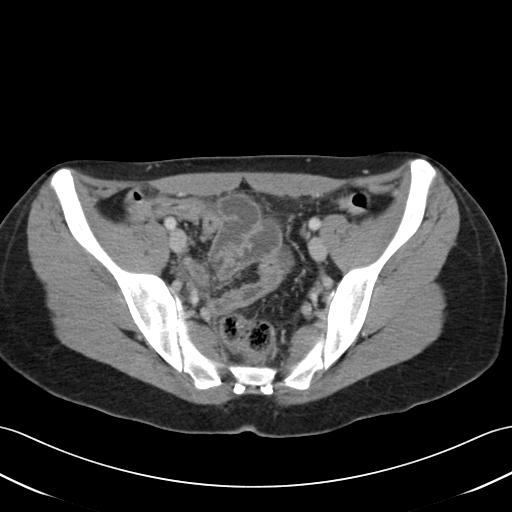
[im 42/100  soft-tissue]
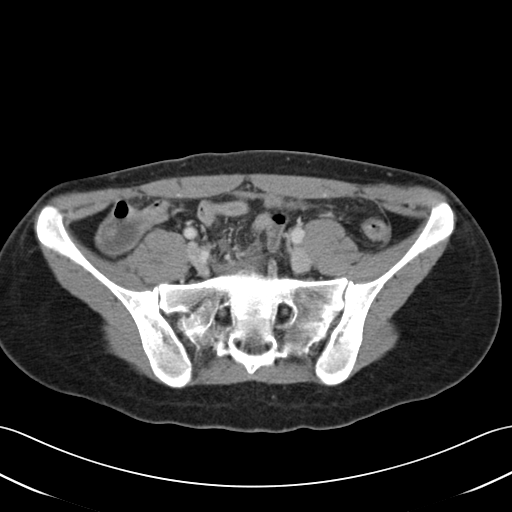
[im 50/100  soft-tissue]
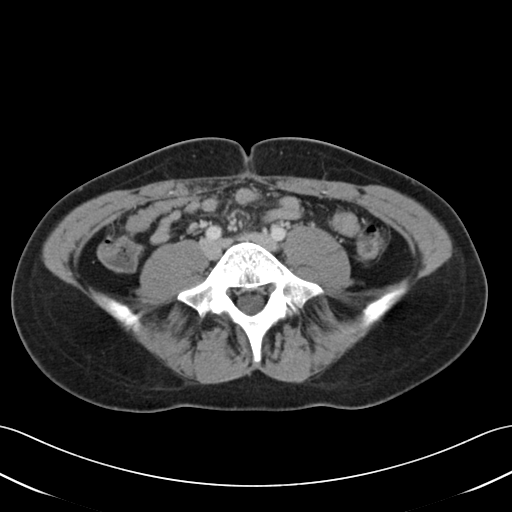
[im 58/100  soft-tissue]
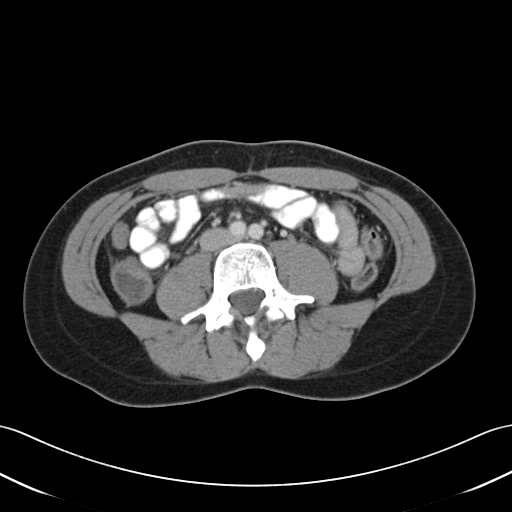
[im 65/100  soft-tissue]
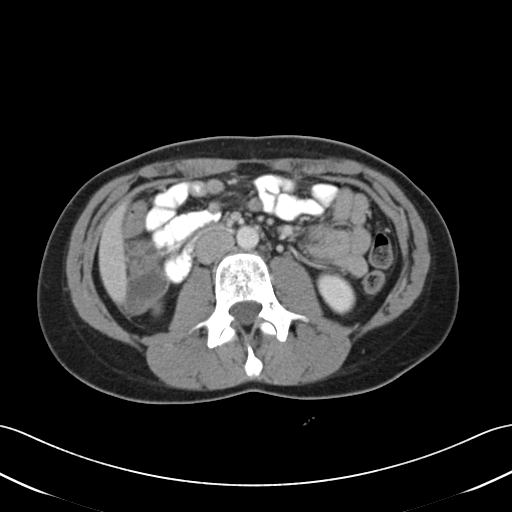
[im 65/100  bone]
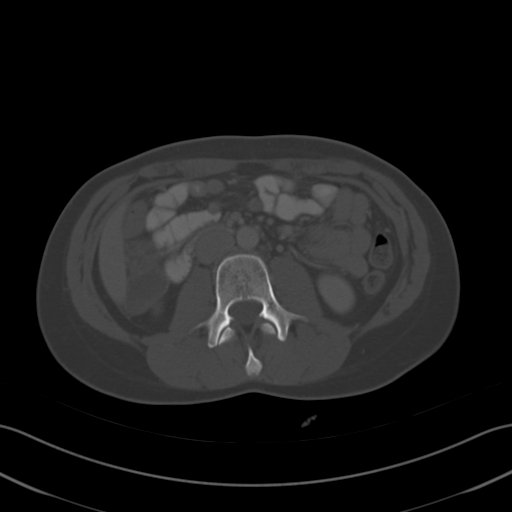
[im 73/100  soft-tissue]
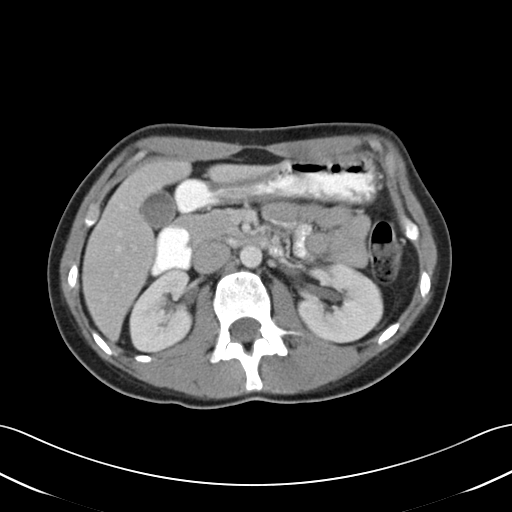
[im 80/100  soft-tissue]
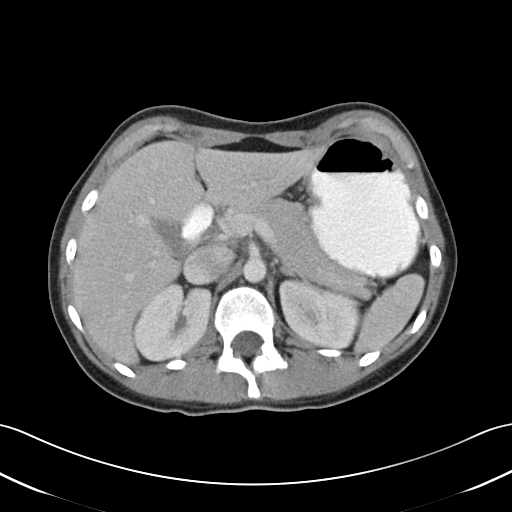
[im 84/100  lung]
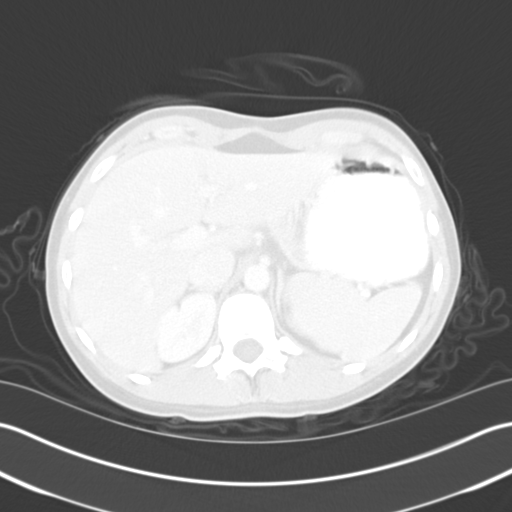
[im 88/100  soft-tissue]
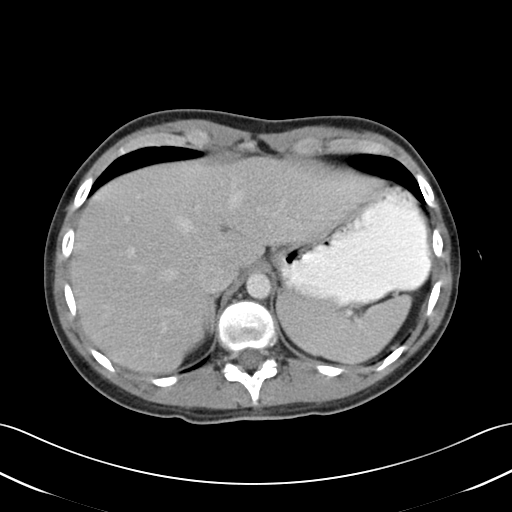
[im 88/100  lung]
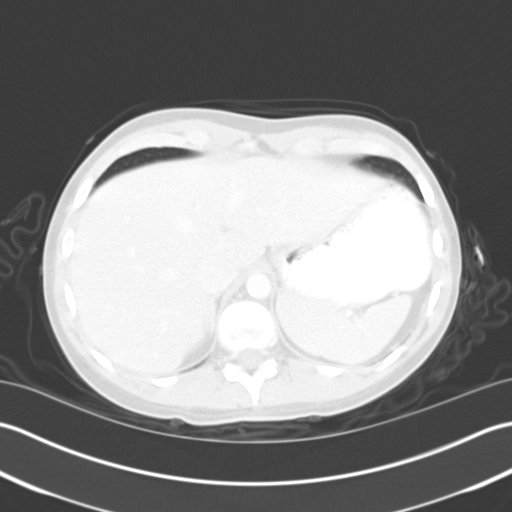
[im 92/100  lung]
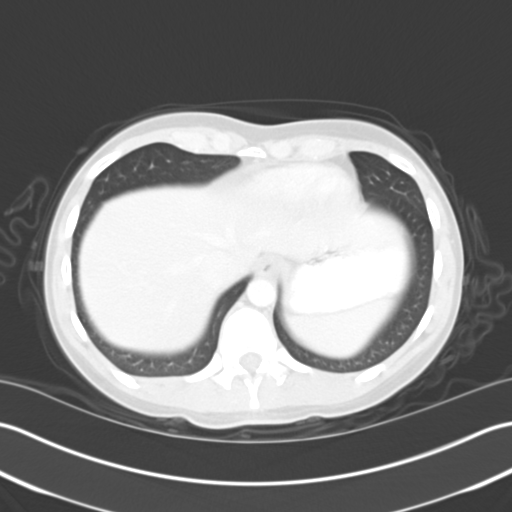
[im 96/100  soft-tissue]
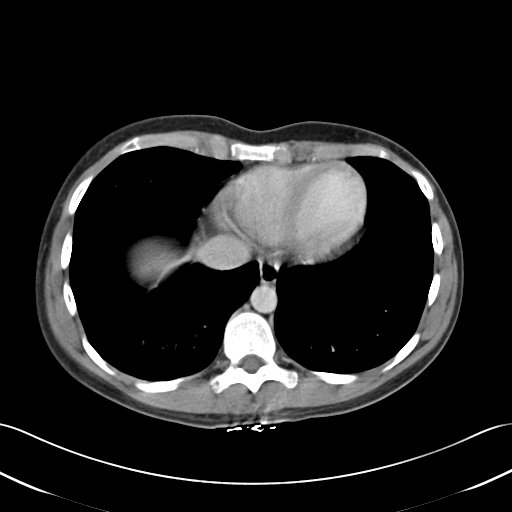
[im 96/100  lung]
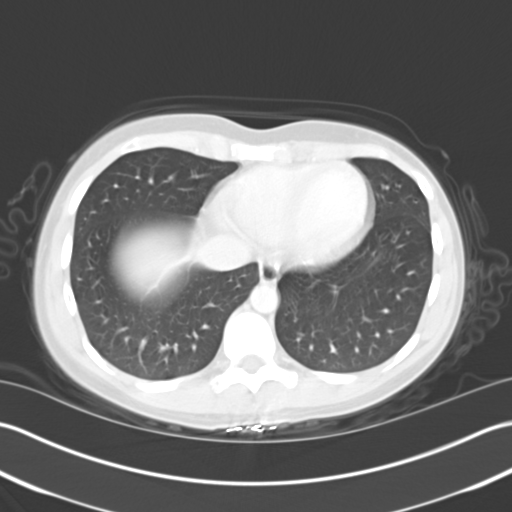

[15 of 32 positions shown; findings below may reference images not displayed]

FINDINGS: Lung bases:  Eft clear.  Heart normal size.

Liver, spleen, gallbladder, pancreas, adrenal glands:  Normal.

Kidneys, ureters, bladder:  Normal.

Uterus and adnexa: Uterus normal in size attenuation. No adnexal
masses. There is a small amount of free fluid in the pelvis. This is
greater than generally seen for physiologic free fluid. Consider a
ruptured ovarian cyst.

Lymph nodes:  No adenopathy.

Gastrointestinal: The appendix extends posteriorly from the cecal
tip. Typically appendix lies adjacent to the right adnexa. There is
focal dilation of the appendiceal tip to just under 1 cm with the
remainder the appendix normal in caliber. Stomach, colon and small
bowel are within normal limits.

Musculoskeletal:  Unremarkable.
IMPRESSION: 1. There is a small amount pelvic free fluid, greater than generally
seen for physiologic fluid. Suspected ruptured ovarian cyst.
Consider follow-up transabdominal and endovaginal ultrasound for
further assessment.
2. There is also focal dilation of the tip of the appendix, which
lies adjacent to the right ovary. Early acute appendicitis is
possible. This could be reassessed with repeat CT imaging of pelvis
[DATE] hours depending on the patient's clinical presentation
3. No other abnormalities.
